# Patient Record
Sex: Female | Born: 1975 | Race: Black or African American | Hispanic: No | Marital: Single | Smoking: Never smoker
Health system: Southern US, Community
[De-identification: ages and names within clinical notes are randomized; demographics above are authoritative.]

---

## 2020-02-03 ENCOUNTER — Encounter: Payer: Self-pay | Admitting: Obstetrics & Gynecology

## 2020-02-22 ENCOUNTER — Other Ambulatory Visit: Payer: Self-pay

## 2020-02-22 ENCOUNTER — Other Ambulatory Visit (HOSPITAL_COMMUNITY)
Admission: RE | Admit: 2020-02-22 | Discharge: 2020-02-22 | Disposition: A | Discharge status: Home / Self Care | Payer: BC Managed Care – PPO | Source: Ambulatory Visit | Attending: Obstetrics & Gynecology | Admitting: Obstetrics & Gynecology

## 2020-02-22 ENCOUNTER — Ambulatory Visit (INDEPENDENT_AMBULATORY_CARE_PROVIDER_SITE_OTHER): Payer: BC Managed Care – PPO | Admitting: Obstetrics & Gynecology

## 2020-02-22 ENCOUNTER — Encounter: Payer: Self-pay | Admitting: Obstetrics & Gynecology

## 2020-02-22 VITALS — BP 119/80 | HR 76 | Wt 234.0 lb

## 2020-02-22 DIAGNOSIS — Z01419 Encounter for gynecological examination (general) (routine) without abnormal findings: Secondary | ICD-10-CM | POA: Insufficient documentation

## 2020-02-22 DIAGNOSIS — Z1272 Encounter for screening for malignant neoplasm of vagina: Secondary | ICD-10-CM

## 2020-02-22 DIAGNOSIS — Z124 Encounter for screening for malignant neoplasm of cervix: Secondary | ICD-10-CM

## 2020-02-22 DIAGNOSIS — Z9889 Other specified postprocedural states: Secondary | ICD-10-CM

## 2020-02-22 DIAGNOSIS — O34219 Maternal care for unspecified type scar from previous cesarean delivery: Secondary | ICD-10-CM

## 2020-02-22 DIAGNOSIS — Z1239 Encounter for other screening for malignant neoplasm of breast: Secondary | ICD-10-CM | POA: Diagnosis not present

## 2020-02-22 NOTE — Progress Notes (Signed)
Subjective:     Kelsey Bailey is a 44 y.o. [id number] and is here for a comprehensive physical exam. The patient reports no complains.  Desires to regain fertility, she's prev CDx3 w/ BTL.  Desires a tubal reversal and desires to conceive, last pregnancy was 3 years ago. .  Social History   Socioeconomic History  . Marital status: Single    Spouse name: Not on file  . Number of children: Not on file  . Years of education: Not on file  . Highest education level: Not on file  Occupational History  . Not on file  Tobacco Use  . Smoking status: Never Smoker  Substance and Sexual Activity  . Alcohol use: Never  . Drug use: Never  . Sexual activity: Never  Other Topics Concern  . Not on file  Social History Narrative  . Not on file   Social Determinants of Health   Financial Resource Strain: Low Risk   . Difficulty of Paying Living Expenses: Not hard at all  Food Insecurity: No Food Insecurity  . Worried About Programme researcher, broadcasting/film/video in the Last Year: Never true  . Ran Out of Food in the Last Year: Never true  Transportation Needs: No Transportation Needs  . Lack of Transportation (Medical): No  . Lack of Transportation (Non-Medical): No  Physical Activity:   . Days of Exercise per Week:   . Minutes of Exercise per Session:   Stress:   . Feeling of Stress :   Social Connections:   . Frequency of Communication with Friends and Family:   . Frequency of Social Gatherings with Friends and Family:   . Attends Religious Services:   . Active Member of Clubs or Organizations:   . Attends Banker Meetings:   Marland Kitchen Marital Status:   Intimate Partner Violence: Not At Risk  . Fear of Current or Ex-Partner: No  . Emotionally Abused: No  . Physically Abused: No  . Sexually Abused: No   Health Maintenance  Topic Date Due  . HIV Screening  Never done  . TETANUS/TDAP  Never done  . PAP SMEAR-Modifier  Never done  . INFLUENZA VACCINE  06/10/2020    The following portions of  the patient's history were reviewed and updated as appropriate: allergies, current medications, past family history, past medical history, past social history, past surgical history and problem list.  Review of Systems A comprehensive review of systems was negative.   Objective:    BP 119/80   Pulse 76   Wt 106.1 kg  General appearance: alert and cooperative Head: Normocephalic, without obvious abnormality, atraumatic Eyes: conjunctivae/corneas clear. PERRL, EOM's intact. Fundi benign. Neck: no adenopathy, no carotid bruit, no JVD, supple, symmetrical, trachea midline and thyroid not enlarged, symmetric, no tenderness/mass/nodules Back: symmetric, no curvature. ROM normal. No CVA tenderness. Lungs: clear to auscultation bilaterally Heart: regular rate and rhythm, S1, S2 normal, no murmur, click, rub or gallop Abdomen: soft, non-tender; bowel sounds normal; no masses,  no organomegaly Pelvic: cervix normal in appearance, external genitalia normal, no adnexal masses or tenderness, no cervical motion tenderness, rectovaginal septum normal, uterus normal size, shape, and consistency and vagina normal without discharge Extremities: extremities normal, atraumatic, no cyanosis or edema Pulses: 2+ and symmetric Skin: Skin color, texture, turgor normal. No rashes or lesions Lymph nodes: Cervical, supraclavicular, and axillary nodes normal. Neurologic: Alert and oriented X 3, normal strength and tone. Normal symmetric reflexes. Normal coordination and gait    Assessment:  Healthy female exam. Pap smear done, mammogram ordered.  Referral given for Infertility clinic.        Plan:    Pt doing well. Counseled on risks of AMA, multiple prior C/S, tubal reversal and briefly discussed fertility options including IVF.  Pt questions answered. F/u 1 year, prn concerns.  See After Visit Summary for Counseling Recommendations

## 2020-02-22 NOTE — Progress Notes (Signed)
mammo

## 2020-02-22 NOTE — Patient Instructions (Signed)
Tubal Ligation Reversal  Tubal ligation is a procedure to close the fallopian tubes so a woman cannot get pregnant. Tubal ligation reversal is a procedure to undo a tubal ligation so that the woman can get pregnant again. During this procedure, the fallopian tubes are reopened, untied, or reconnected to separated sections. The ability to get pregnant again after having this procedure depends on:  Your age.  The type of tubal ligation that you had.  The length of your remaining fallopian tubes.  How healthy the tubes are.  The amount of scar tissue in your pelvic area.  Your partner's fertility. Tell a health care provider about:  Any allergies you have.  All medicines you are taking, including vitamins, herbs, eye drops, creams, and over-the-counter medicines.  Any problems you or family members have had with anesthetic medicines.  Any blood disorders you have.  Any surgeries you have had.  Any medical conditions you have.  Any past pregnancies. What are the risks? Generally, this is a safe procedure. However, problems may occur, including:  Failure to reverse the original procedure.  Future blockage of a reopened or reconnected fallopian tube.  Inability to get pregnant.  Ectopic pregnancy. This is a pregnancy in which a fertilized egg does not attach inside the uterus.  Infection.  Bleeding.  Damage to other structures or organs.  Blood clots in the legs and chest.  Allergic reactions to medicines. What happens before the procedure? Staying hydrated Follow instructions from your health care provider about hydration, which may include:  Up to 2 hours before the procedure - you may continue to drink clear liquids, such as water, clear fruit juice, black coffee, and plain tea. Eating and drinking restrictions Follow instructions from your health care provider about eating and drinking, which may include:  8 hours before the procedure - stop eating heavy meals  or foods such as meat, fried foods, or fatty foods.  6 hours before the procedure - stop eating light meals or foods, such as toast or cereal.  6 hours before the procedure - stop drinking milk or drinks that contain milk.  2 hours before the procedure - stop drinking clear liquids. General instructions  You and your partner may need to have a physical exam and blood and imaging tests to make sure that you do not have any unknown fertility problems.  Ask your health care provider about: ? Changing or stopping your regular medicines. This is especially important if you take diabetes medicines or blood thinners. ? Taking over-the-counter medicines, vitamins, herbs, and supplements. ? Taking medicines such as aspirin and ibuprofen. These medicines can thin your blood. Do not take these medicines unless your health care provider tells you to take them.  Plan to have someone take you home after the procedure.  If you go home right after the procedure, plan to have someone with you for 24 hours. What happens during the procedure?  To reduce your risk of infection: ? Your health care team will wash or sanitize their hands. ? Hair may be removed from the surgical area. ? Your skin will be washed with soap.  An IV will be inserted into one of your veins.  You will be given one or more of the following: ? A medicine to help you relax (sedative). ? A medicine that is injected into an area of your body that numbs everything below the injection site (regional anesthetic). ? A medicine that numbs the area (local anesthetic). ? A medicine  to make you fall asleep (general anesthetic).  A tube may be put down your throat to help you breathe.  Your bladder may be emptied with a small, thin tube (catheter).  Small cuts (incisions) will be made in your abdomen.  Your abdomen will be inflated with a gas. This will give the surgeon room to work and enable him or her to see your organs clearly.  A  thin, lighted tube with a camera (laparoscope) will be inserted through an incision into the pelvic area. Small instruments will be inserted through the other incision.  Any clips, rings, or clamps that were used to close your fallopian tubes will be removed, and any unconnected sections of your fallopian tubes will be reconnected.  Your fallopian tubes will be tested to make sure that they are open.  The incisions will be closed with stitches (sutures).  A bandage (dressing) will be placed over the incisions. The procedure may vary among health care providers and hospitals. What happens after the procedure?  Your blood pressure, heart rate, breathing rate, and blood oxygen level will be monitored until the medicines you were given have worn off.  You will be given medicine for pain, nausea, or vomiting as needed.  You will be encouraged to walk as soon as possible.  Do not drive for 24 hours if you received a sedative. Summary  Tubal ligation reversal is a procedure to undo a tubal ligation so that a woman can get pregnant again.  During this procedure, the fallopian tubes are reopened, untied, or reconnected to separated sections.  Follow instructions from your health care provider about eating and drinking before the procedure. This information is not intended to replace advice given to you by your health care provider. Make sure you discuss any questions you have with your health care provider. Document Revised: 10/09/2017 Document Reviewed: 02/05/2017 Elsevier Patient Education  2020 [employer].

## 2020-02-24 LAB — CYTOLOGY - PAP
Adequacy: ABSENT
Chlamydia: NEGATIVE
Comment: NEGATIVE
Comment: NEGATIVE
Comment: NORMAL
Diagnosis: NEGATIVE
High risk HPV: NEGATIVE
Neisseria Gonorrhea: NEGATIVE

## 2020-02-27 ENCOUNTER — Ambulatory Visit: Payer: BC Managed Care – PPO

## 2020-03-01 ENCOUNTER — Other Ambulatory Visit: Payer: Self-pay

## 2020-03-01 ENCOUNTER — Ambulatory Visit
Admission: RE | Admit: 2020-03-01 | Discharge: 2020-03-01 | Disposition: A | Discharge status: Home / Self Care | Payer: BC Managed Care – PPO | Source: Ambulatory Visit | Attending: Obstetrics & Gynecology | Admitting: Obstetrics & Gynecology

## 2020-03-01 DIAGNOSIS — Z01419 Encounter for gynecological examination (general) (routine) without abnormal findings: Secondary | ICD-10-CM

## 2020-12-18 ENCOUNTER — Other Ambulatory Visit: Payer: Self-pay | Admitting: Internal Medicine

## 2020-12-19 LAB — EXTRA LAV TOP TUBE

## 2020-12-19 LAB — CLIENT EDUCATION TRACKING

## 2020-12-19 LAB — HCG, SERUM, QUALITATIVE: Preg, Serum: NEGATIVE

## 2021-03-15 ENCOUNTER — Other Ambulatory Visit: Payer: Self-pay | Admitting: Internal Medicine

## 2021-03-16 LAB — LIPID PANEL
Cholesterol: 250 mg/dL — ABNORMAL HIGH (ref <200)
HDL: 53 mg/dL (ref >=50)
LDL Cholesterol (Calc): 175 mg/dL (calc) — ABNORMAL HIGH
Non-HDL Cholesterol (Calc): 197 mg/dL (calc) — ABNORMAL HIGH (ref <130)
Total CHOL/HDL Ratio: 4.7 (calc) (ref <5.0)
Triglycerides: 98 mg/dL (ref <150)

## 2021-03-16 LAB — EXTRA LAV TOP TUBE

## 2021-05-23 ENCOUNTER — Other Ambulatory Visit: Payer: Self-pay | Admitting: Internal Medicine

## 2021-05-23 DIAGNOSIS — Z1231 Encounter for screening mammogram for malignant neoplasm of breast: Secondary | ICD-10-CM

## 2021-05-28 ENCOUNTER — Inpatient Hospital Stay: Admission: RE | Admit: 2021-05-28 | Payer: BC Managed Care – PPO | Source: Ambulatory Visit

## 2021-05-31 ENCOUNTER — Other Ambulatory Visit: Payer: Self-pay | Admitting: Internal Medicine

## 2021-05-31 DIAGNOSIS — N6452 Nipple discharge: Secondary | ICD-10-CM

## 2021-05-31 DIAGNOSIS — N644 Mastodynia: Secondary | ICD-10-CM

## 2021-07-05 ENCOUNTER — Ambulatory Visit
Admission: RE | Admit: 2021-07-05 | Discharge: 2021-07-05 | Disposition: A | Discharge status: Home / Self Care | Payer: BC Managed Care – PPO | Source: Ambulatory Visit | Attending: Internal Medicine | Admitting: Internal Medicine

## 2021-07-05 ENCOUNTER — Other Ambulatory Visit: Payer: Self-pay

## 2021-07-05 DIAGNOSIS — N6452 Nipple discharge: Secondary | ICD-10-CM

## 2021-07-05 DIAGNOSIS — N644 Mastodynia: Secondary | ICD-10-CM

## 2021-07-17 ENCOUNTER — Other Ambulatory Visit: Payer: Self-pay | Admitting: Internal Medicine

## 2021-07-18 LAB — CBC
HCT: 41.6 % (ref 35.0–45.0)
Hemoglobin: 13.6 g/dL (ref 11.7–15.5)
MCH: 29 pg (ref 27.0–33.0)
MCHC: 32.7 g/dL (ref 32.0–36.0)
MCV: 88.7 fL (ref 80.0–100.0)
MPV: 10.9 fL (ref 7.5–12.5)
Platelets: 210 10*3/uL (ref 140–400)
RBC: 4.69 10*6/uL (ref 3.80–5.10)
RDW: 13.3 % (ref 11.0–15.0)
WBC: 6.7 10*3/uL (ref 3.8–10.8)

## 2021-07-18 LAB — COMPLETE METABOLIC PANEL WITH GFR
AG Ratio: 1.4 (calc) (ref 1.0–2.5)
ALT: 8 U/L (ref 6–29)
AST: 18 U/L (ref 10–30)
Albumin: 4.4 g/dL (ref 3.6–5.1)
Alkaline phosphatase (APISO): 60 U/L (ref 31–125)
BUN: 12 mg/dL (ref 7–25)
CO2: 20 mmol/L (ref 20–32)
Calcium: 9.6 mg/dL (ref 8.6–10.2)
Chloride: 106 mmol/L (ref 98–110)
Creat: 0.86 mg/dL (ref 0.50–0.99)
Globulin: 3.2 g/dL (calc) (ref 1.9–3.7)
Glucose, Bld: 103 mg/dL — ABNORMAL HIGH (ref 65–99)
Potassium: 4.5 mmol/L (ref 3.5–5.3)
Sodium: 140 mmol/L (ref 135–146)
Total Bilirubin: 0.6 mg/dL (ref 0.2–1.2)
Total Protein: 7.6 g/dL (ref 6.1–8.1)
eGFR: 86 mL/min/{1.73_m2} (ref >=60)

## 2021-07-18 LAB — VITAMIN D 25 HYDROXY (VIT D DEFICIENCY, FRACTURES): Vit D, 25-Hydroxy: 27 ng/mL — ABNORMAL LOW (ref 30–100)

## 2021-07-18 LAB — LIPID PANEL
Cholesterol: 256 mg/dL — ABNORMAL HIGH (ref <200)
HDL: 60 mg/dL (ref >=50)
LDL Cholesterol (Calc): 173 mg/dL (calc) — ABNORMAL HIGH
Non-HDL Cholesterol (Calc): 196 mg/dL (calc) — ABNORMAL HIGH (ref <130)
Total CHOL/HDL Ratio: 4.3 (calc) (ref <5.0)
Triglycerides: 108 mg/dL (ref <150)

## 2021-07-18 LAB — TSH: TSH: 2.25 mIU/L

## 2022-02-27 IMAGING — MG DIGITAL SCREENING BILAT W/ CAD
4 series · 4 of 4 positions shown · non-contrast
Comparison: None.

CLINICAL DATA: Screening.

EXAM:
DIGITAL SCREENING BILATERAL MAMMOGRAM WITH CAD

[R CC]
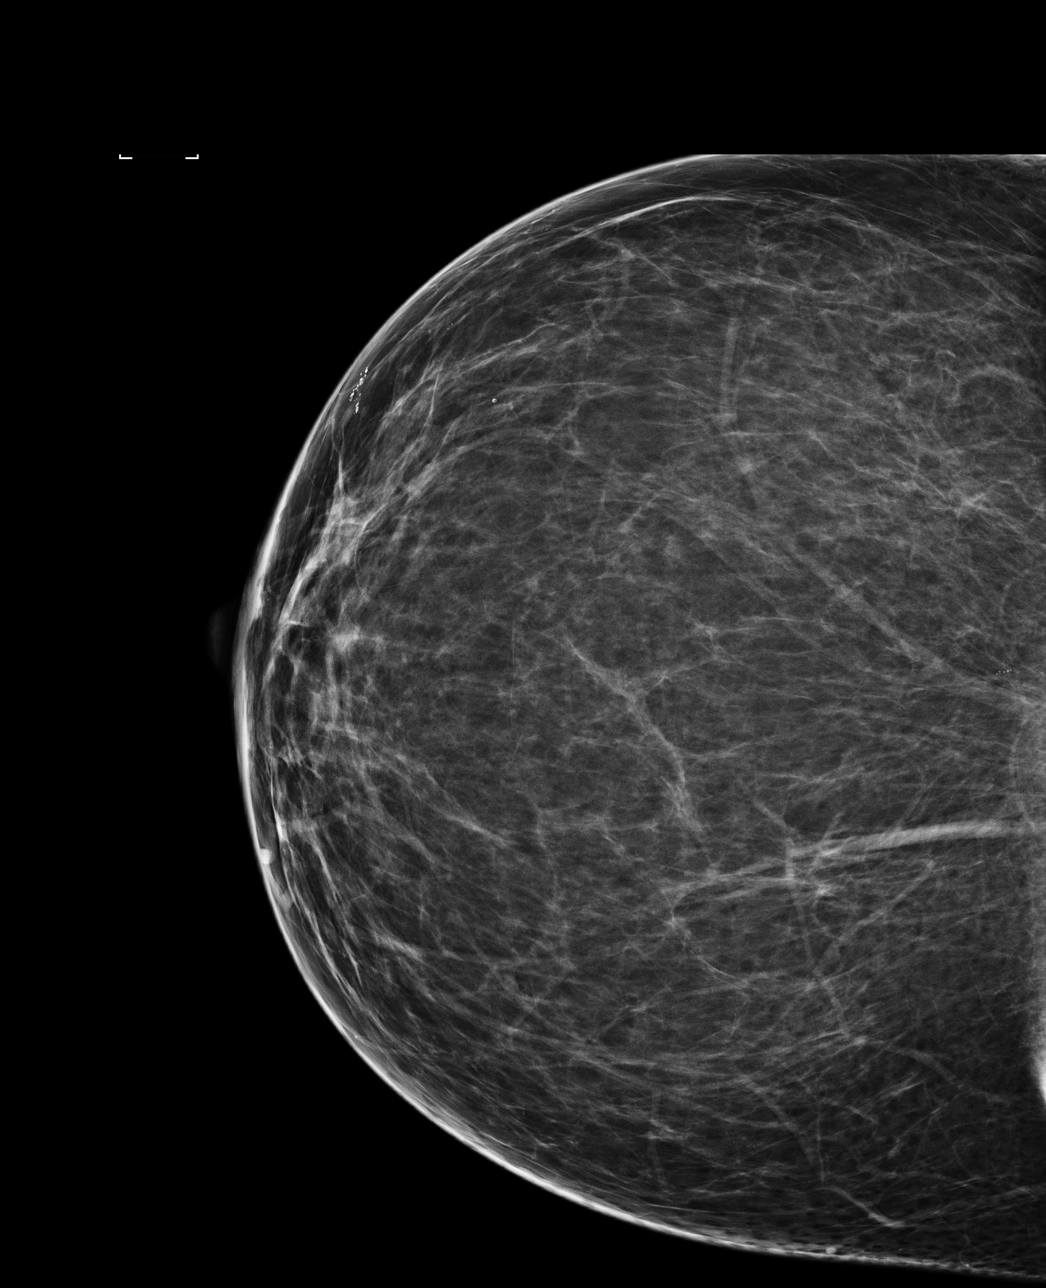

[L MLO]
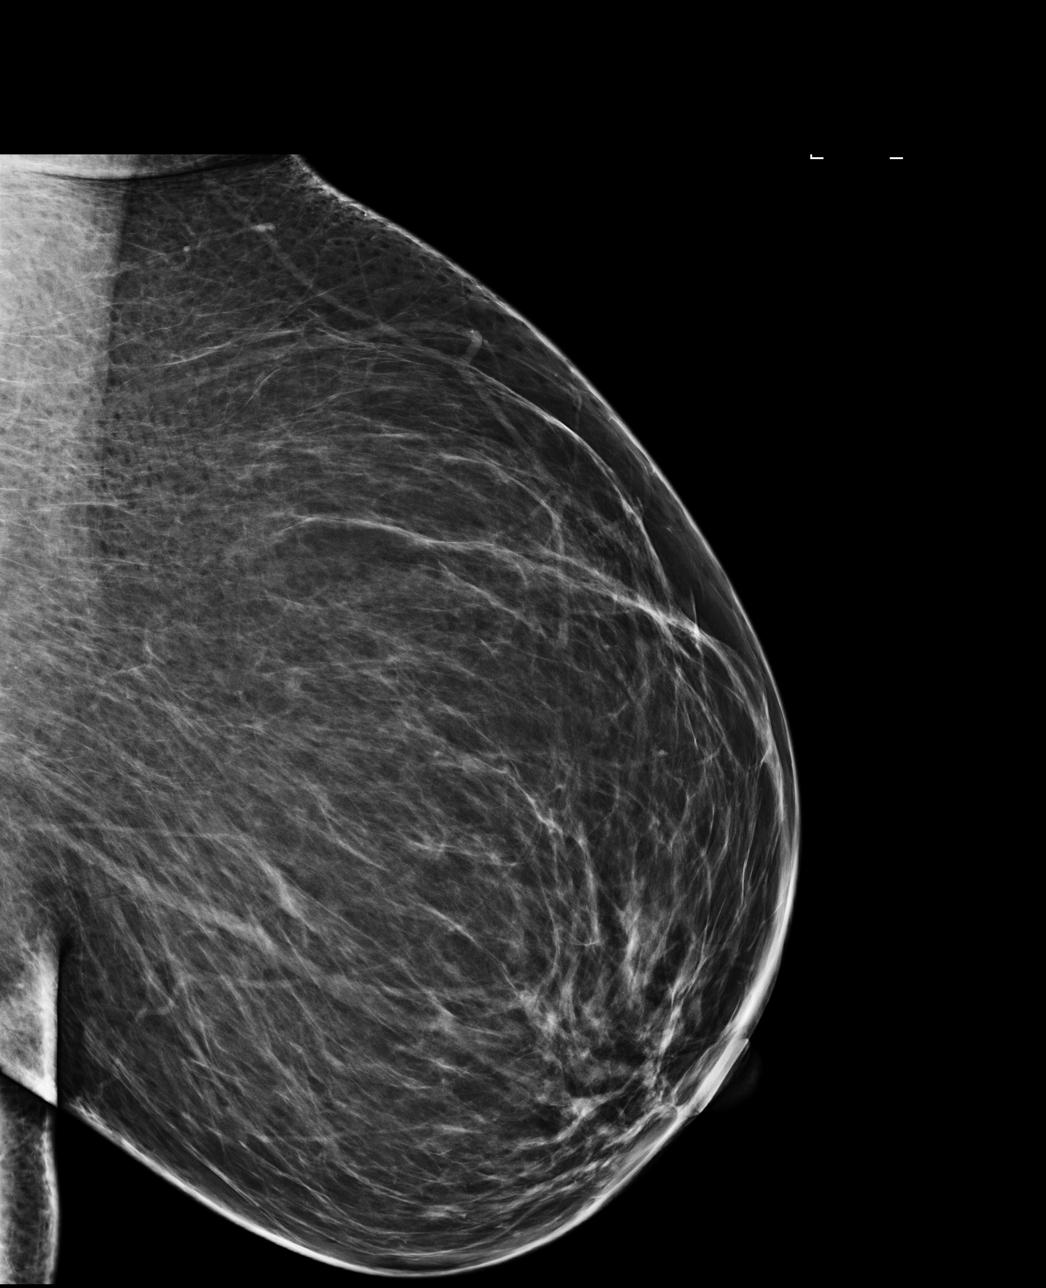

[L CC]
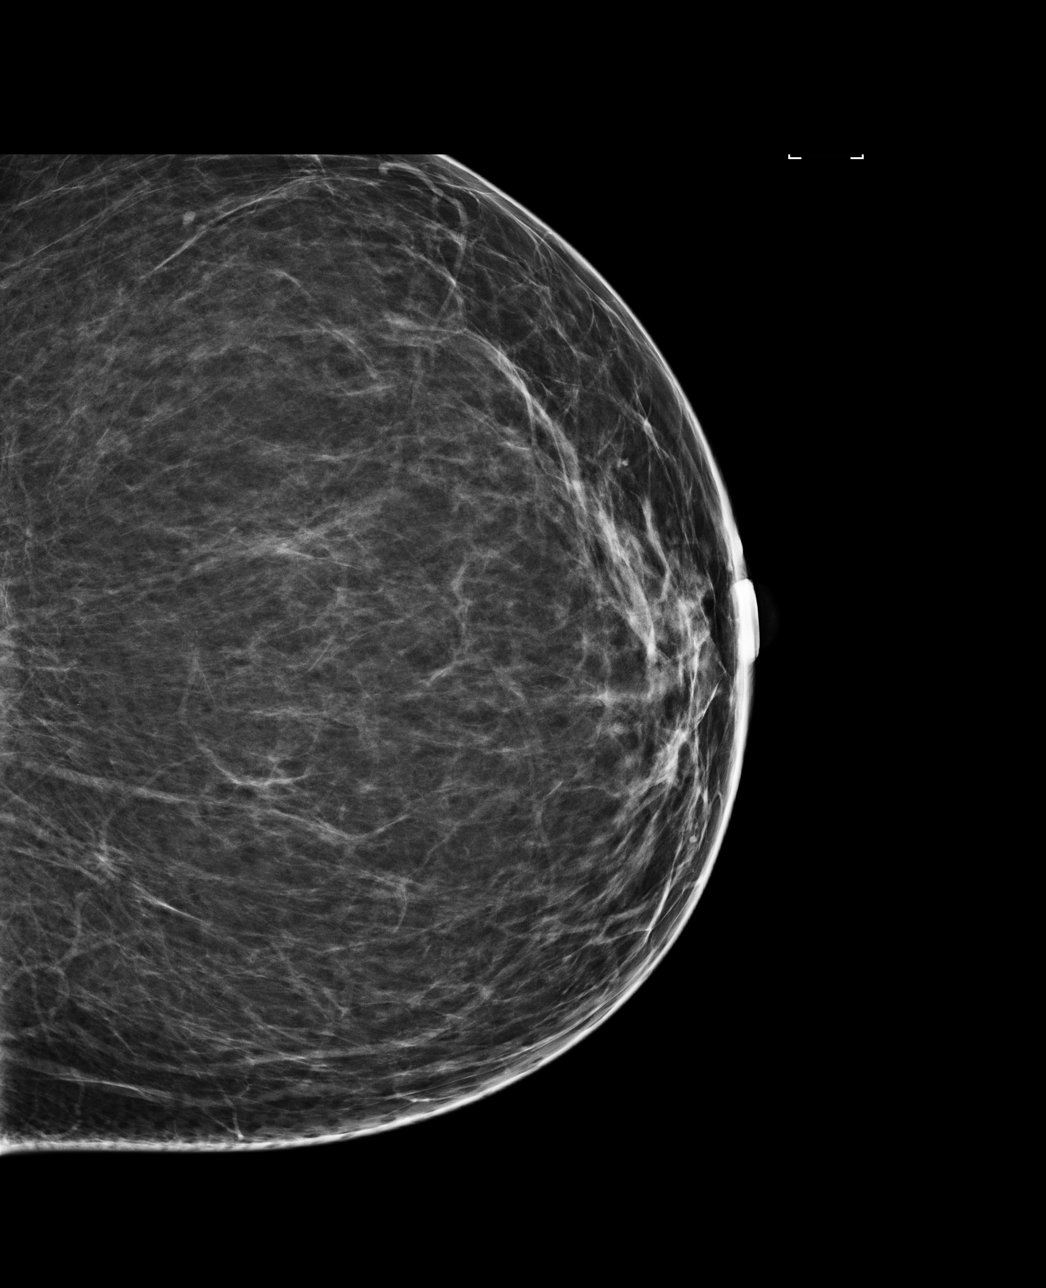

[R MLO]
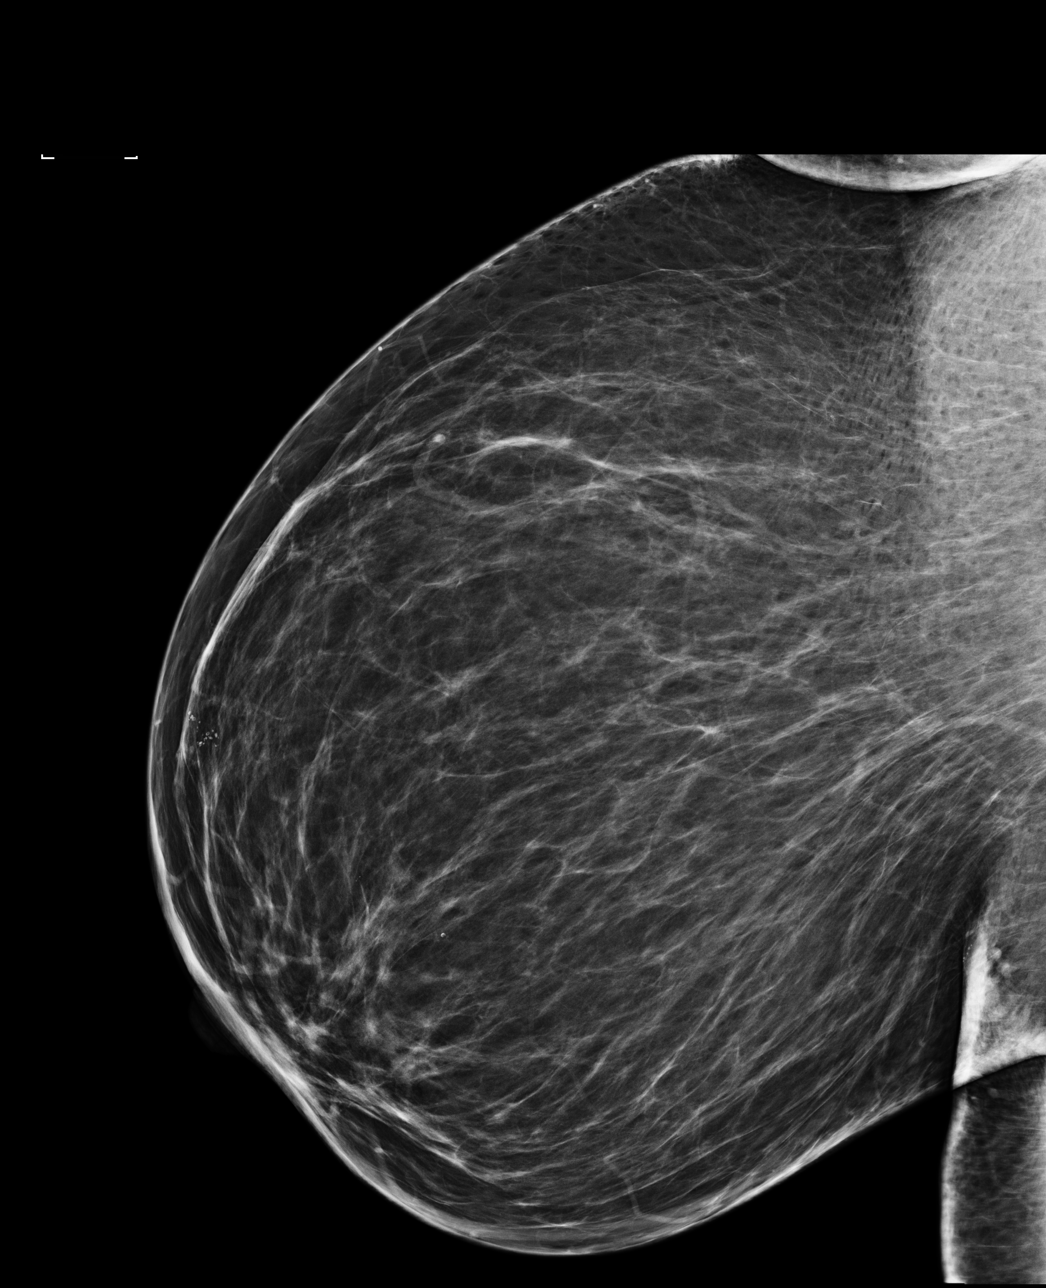

[4 of 4 positions shown; findings below may reference images not displayed]

ACR Breast Density Category b: There are scattered areas of
fibroglandular density.
FINDINGS: There are no findings suspicious for malignancy. Images were
processed with CAD.
IMPRESSION: No mammographic evidence of malignancy. A result letter of this
screening mammogram will be mailed directly to the patient.

RECOMMENDATION:
Screening mammogram in one year. (Code:[id number]-V-[id number])

BI-RADS CATEGORY  1: Negative.

## 2022-04-17 ENCOUNTER — Other Ambulatory Visit: Payer: Self-pay | Admitting: Internal Medicine

## 2022-04-18 ENCOUNTER — Other Ambulatory Visit: Payer: Self-pay | Admitting: Internal Medicine

## 2022-04-18 DIAGNOSIS — Z1231 Encounter for screening mammogram for malignant neoplasm of breast: Secondary | ICD-10-CM

## 2022-04-18 LAB — COMPLETE METABOLIC PANEL WITH GFR
AG Ratio: 1.1 (calc) (ref 1.0–2.5)
ALT: 11 U/L (ref 6–29)
AST: 14 U/L (ref 10–30)
Albumin: 4.1 g/dL (ref 3.6–5.1)
Alkaline phosphatase (APISO): 54 U/L (ref 31–125)
BUN/Creatinine Ratio: 17 (calc) (ref 6–22)
BUN: 17 mg/dL (ref 7–25)
CO2: 39 mmol/L — ABNORMAL HIGH (ref 20–32)
Calcium: 9.5 mg/dL (ref 8.6–10.2)
Chloride: 103 mmol/L (ref 98–110)
Creat: 1.02 mg/dL — ABNORMAL HIGH (ref 0.50–0.99)
Globulin: 3.7 g/dL (calc) (ref 1.9–3.7)
Glucose, Bld: 113 mg/dL — ABNORMAL HIGH (ref 65–99)
Potassium: 4.2 mmol/L (ref 3.5–5.3)
Sodium: 140 mmol/L (ref 135–146)
Total Bilirubin: 0.6 mg/dL (ref 0.2–1.2)
Total Protein: 7.8 g/dL (ref 6.1–8.1)
eGFR: 70 mL/min/{1.73_m2} (ref >=60)

## 2022-04-18 LAB — LIPID PANEL
Cholesterol: 284 mg/dL — ABNORMAL HIGH (ref <200)
HDL: 58 mg/dL (ref >=50)
LDL Cholesterol (Calc): 200 mg/dL (calc) — ABNORMAL HIGH
Non-HDL Cholesterol (Calc): 226 mg/dL (calc) — ABNORMAL HIGH (ref <130)
Total CHOL/HDL Ratio: 4.9 (calc) (ref <5.0)
Triglycerides: 120 mg/dL (ref <150)

## 2022-04-18 LAB — CBC
HCT: 39.9 % (ref 35.0–45.0)
Hemoglobin: 13.4 g/dL (ref 11.7–15.5)
MCH: 29.6 pg (ref 27.0–33.0)
MCHC: 33.6 g/dL (ref 32.0–36.0)
MCV: 88.1 fL (ref 80.0–100.0)
MPV: 10.4 fL (ref 7.5–12.5)
Platelets: 265 10*3/uL (ref 140–400)
RBC: 4.53 10*6/uL (ref 3.80–5.10)
RDW: 13 % (ref 11.0–15.0)
WBC: 7.6 10*3/uL (ref 3.8–10.8)

## 2022-04-18 LAB — VITAMIN D 25 HYDROXY (VIT D DEFICIENCY, FRACTURES): Vit D, 25-Hydroxy: 14 ng/mL — ABNORMAL LOW (ref 30–100)

## 2022-04-18 LAB — TSH: TSH: 1.36 mIU/L

## 2022-09-15 ENCOUNTER — Other Ambulatory Visit: Payer: Self-pay | Admitting: Obstetrics and Gynecology

## 2022-09-15 DIAGNOSIS — Z3141 Encounter for fertility testing: Secondary | ICD-10-CM

## 2022-09-22 ENCOUNTER — Other Ambulatory Visit: Payer: BC Managed Care – PPO

## 2022-09-22 ENCOUNTER — Ambulatory Visit
Admission: RE | Admit: 2022-09-22 | Discharge: 2022-09-22 | Disposition: A | Discharge status: Home / Self Care | Payer: Commercial Managed Care - HMO | Source: Ambulatory Visit | Attending: Obstetrics and Gynecology | Admitting: Obstetrics and Gynecology

## 2022-09-22 DIAGNOSIS — Z3141 Encounter for fertility testing: Secondary | ICD-10-CM

## 2022-10-20 ENCOUNTER — Ambulatory Visit
Admission: RE | Admit: 2022-10-20 | Discharge: 2022-10-20 | Disposition: A | Discharge status: Home / Self Care | Payer: Commercial Managed Care - HMO | Source: Ambulatory Visit | Attending: Obstetrics and Gynecology | Admitting: Obstetrics and Gynecology

## 2022-10-20 DIAGNOSIS — Z3141 Encounter for fertility testing: Secondary | ICD-10-CM

## 2023-07-03 IMAGING — US US BREAST*L* LIMITED INC AXILLA
1 series · 3 of 3 positions shown · non-contrast
Comparison: None.

CLINICAL DATA: 45-year-old female with single episode of non
spontaneous milky LEFT nipple discharge 3 months ago. Baseline
mammogram.

EXAM:
DIGITAL DIAGNOSTIC BILATERAL MAMMOGRAM WITH TOMOSYNTHESIS AND CAD;
ULTRASOUND LEFT BREAST LIMITED
TECHNIQUE: Bilateral digital diagnostic mammography and breast tomosynthesis
was performed. The images were evaluated with computer-aided
detection.; Targeted ultrasound examination of the left breast was
performed.

[Series 1: us breast*left* limited inc axilla · 0.07mm/px · 3 of 3 slices shown]
[im 1/3]
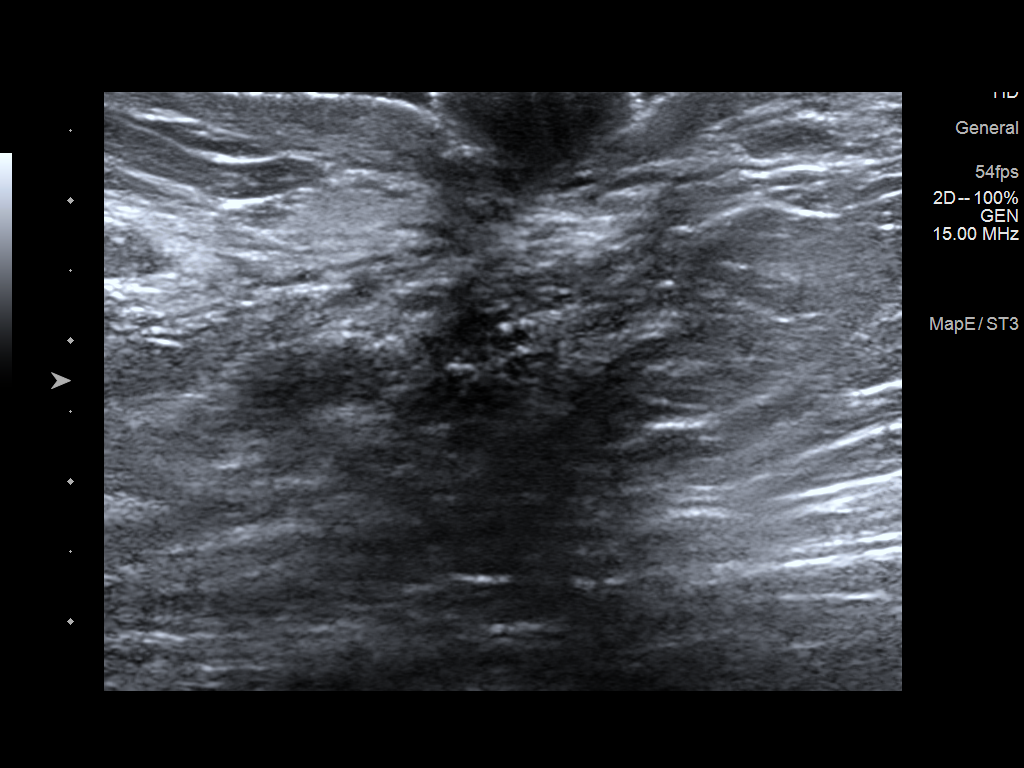
[im 2/3]
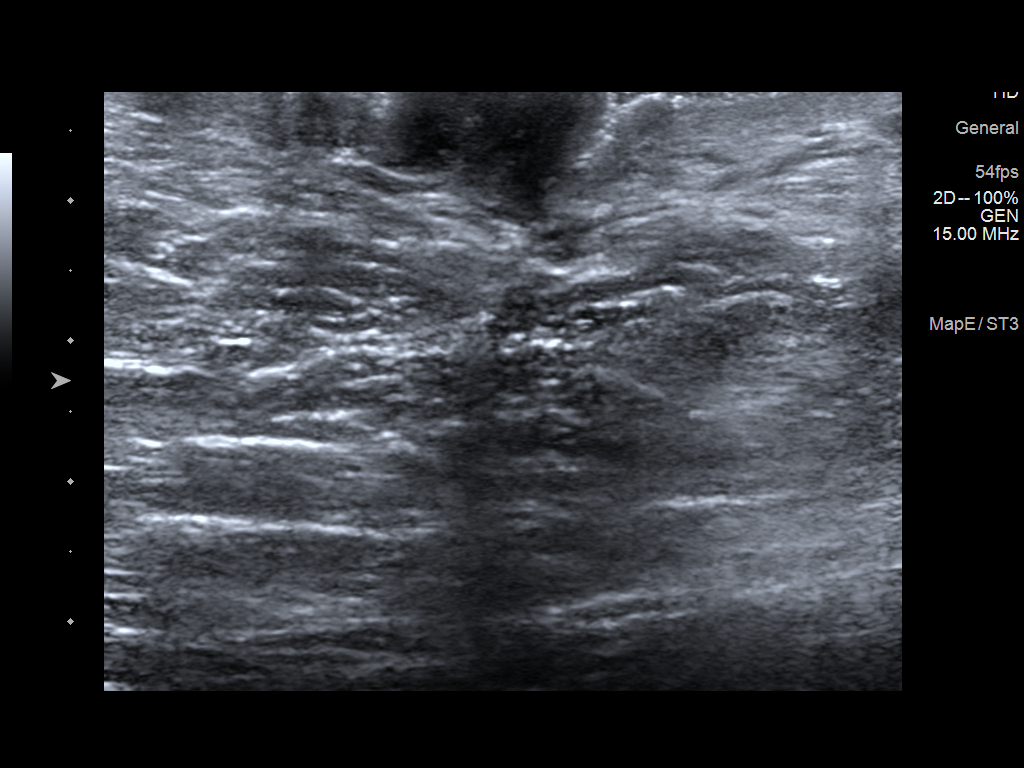
[im 3/3]
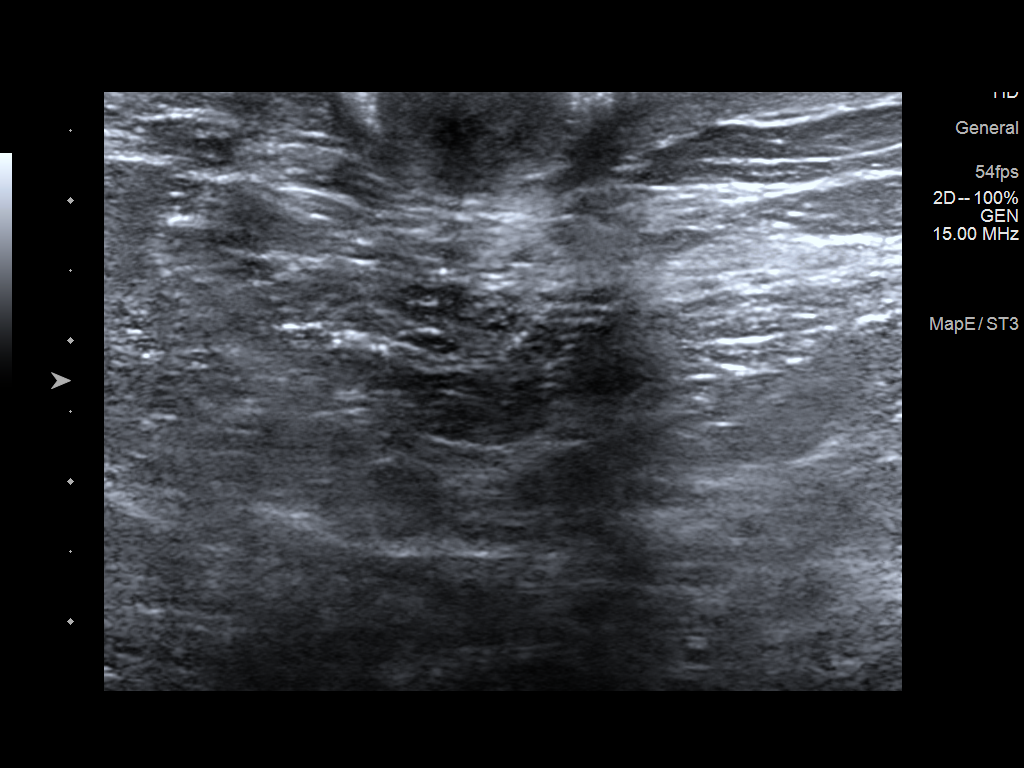

[3 of 3 positions shown; findings below may reference images not displayed]

ACR Breast Density Category b: There are scattered areas of
fibroglandular density.
FINDINGS: 2D/3D full field views of both breasts and spot compression views of
the RETROAREOLAR LEFT breast demonstrate no suspicious mass,
distortion or worrisome calcifications.

On physical exam, no LEFT nipple discharge could be elicited.

Targeted ultrasound is performed, showing no sonographic
abnormalities within the RETROAREOLAR LEFT breast.
IMPRESSION: 1. No cause for patient's reported single episode of non spontaneous
LEFT nipple discharge. The single episode of non spontaneous LEFT
nipple discharge is almost certainly benign but she was instructed
to contact us or her physician's office if any further nipple
discharge is noted.
2. No mammographic evidence of breast malignancy.

RECOMMENDATION:
Consider clinical follow-up as indicated. Any further workup should
be based on clinical grounds. She was instructed to contact her
physician or our office if any further episodes of nipple discharge
noted. Breast MRI may be warranted at that time.

Bilateral screening mammogram in 1 year.

I have discussed the findings and recommendations with the patient.
If applicable, a reminder letter will be sent to the patient
regarding the next appointment.

BI-RADS CATEGORY  1: Negative.
